# Patient Record
Sex: Female | Born: 1960 | Race: Black or African American | Hispanic: No | Marital: Single | Smoking: Never smoker
Health system: Southern US, Community
[De-identification: ages and names within clinical notes are randomized; demographics above are authoritative.]

## PROBLEM LIST (undated history)

## (undated) DIAGNOSIS — E669 Obesity, unspecified: Secondary | ICD-10-CM

## (undated) DIAGNOSIS — I10 Essential (primary) hypertension: Secondary | ICD-10-CM

---

## 2008-09-15 ENCOUNTER — Encounter: Admission: RE | Admit: 2008-09-15 | Discharge: 2008-12-14 | Payer: Self-pay | Admitting: Family Medicine

## 2009-01-01 ENCOUNTER — Encounter: Admission: RE | Admit: 2009-01-01 | Discharge: 2009-04-01 | Payer: Self-pay | Admitting: Family Medicine

## 2009-07-07 ENCOUNTER — Encounter: Admission: RE | Admit: 2009-07-07 | Discharge: 2009-07-07 | Payer: Self-pay | Admitting: Family Medicine

## 2009-07-25 ENCOUNTER — Emergency Department (HOSPITAL_BASED_OUTPATIENT_CLINIC_OR_DEPARTMENT_OTHER): Admission: EM | Admit: 2009-07-25 | Discharge: 2009-07-25 | Payer: Self-pay | Admitting: Emergency Medicine

## 2009-10-06 ENCOUNTER — Encounter: Admission: RE | Admit: 2009-10-06 | Discharge: 2009-10-06 | Payer: Self-pay | Admitting: Family Medicine

## 2010-03-01 ENCOUNTER — Encounter
Admission: RE | Admit: 2010-03-01 | Discharge: 2010-05-03 | Payer: Self-pay | Source: Home / Self Care | Attending: Family Medicine | Admitting: Family Medicine

## 2010-05-23 ENCOUNTER — Encounter: Payer: Self-pay | Admitting: Family Medicine

## 2010-06-13 ENCOUNTER — Encounter: Payer: Medicare Other | Attending: Family Medicine | Admitting: *Deleted

## 2010-06-13 ENCOUNTER — Encounter: Admit: 2010-06-13 | Payer: Self-pay | Admitting: Family Medicine

## 2010-06-13 DIAGNOSIS — Z713 Dietary counseling and surveillance: Secondary | ICD-10-CM | POA: Insufficient documentation

## 2010-07-25 LAB — COMPREHENSIVE METABOLIC PANEL
ALT: 11 U/L (ref 0–35)
AST: 22 U/L (ref 0–37)
Albumin: 4.1 g/dL (ref 3.5–5.2)
Alkaline Phosphatase: 122 U/L — ABNORMAL HIGH (ref 39–117)
Chloride: 102 mEq/L (ref 96–112)
GFR calc Af Amer: >60 mL/min (ref >60)
Potassium: 4.3 mEq/L (ref 3.5–5.1)
Sodium: 140 mEq/L (ref 135–145)
Total Bilirubin: 0.9 mg/dL (ref 0.3–1.2)

## 2010-07-25 LAB — URINALYSIS, ROUTINE W REFLEX MICROSCOPIC
Glucose, UA: NEGATIVE mg/dL
Protein, ur: NEGATIVE mg/dL
Urobilinogen, UA: 0.2 mg/dL (ref 0.0–1.0)

## 2010-07-25 LAB — CBC
HCT: 39.4 % (ref 36.0–46.0)
Hemoglobin: 12.9 g/dL (ref 12.0–15.0)
MCHC: 32.7 g/dL (ref 30.0–36.0)
MCV: 82.2 fL (ref 78.0–100.0)
Platelets: 230 K/uL (ref 150–400)
RBC: 4.79 MIL/uL (ref 3.87–5.11)
RDW: 14.1 % (ref 11.5–15.5)
WBC: 6.4 K/uL (ref 4.0–10.5)

## 2010-07-25 LAB — DIFFERENTIAL
Basophils Absolute: 0.1 K/uL (ref 0.0–0.1)
Basophils Relative: 2 % — ABNORMAL HIGH (ref 0–1)
Eosinophils Absolute: 0.1 K/uL (ref 0.0–0.7)
Eosinophils Relative: 1 % (ref 0–5)
Lymphocytes Relative: 16 % (ref 12–46)
Lymphs Abs: 1.1 K/uL (ref 0.7–4.0)
Monocytes Absolute: 0.3 K/uL (ref 0.1–1.0)
Monocytes Relative: 5 % (ref 3–12)
Neutro Abs: 4.8 K/uL (ref 1.7–7.7)
Neutrophils Relative %: 76 % (ref 43–77)

## 2010-08-11 ENCOUNTER — Ambulatory Visit: Payer: Self-pay | Admitting: *Deleted

## 2010-08-18 ENCOUNTER — Ambulatory Visit: Payer: Self-pay | Admitting: *Deleted

## 2011-02-07 ENCOUNTER — Ambulatory Visit: Payer: Medicare Other | Attending: Family Medicine | Admitting: Physical Therapy

## 2011-02-07 DIAGNOSIS — M6281 Muscle weakness (generalized): Secondary | ICD-10-CM | POA: Insufficient documentation

## 2011-02-07 DIAGNOSIS — M545 Low back pain, unspecified: Secondary | ICD-10-CM | POA: Insufficient documentation

## 2011-02-07 DIAGNOSIS — IMO0001 Reserved for inherently not codable concepts without codable children: Secondary | ICD-10-CM | POA: Insufficient documentation

## 2011-02-14 ENCOUNTER — Ambulatory Visit: Payer: Medicare Other | Admitting: Physical Therapy

## 2011-02-22 ENCOUNTER — Ambulatory Visit: Payer: Medicare Other | Admitting: Physical Therapy

## 2011-03-06 ENCOUNTER — Ambulatory Visit: Payer: Medicare Other | Attending: Family Medicine | Admitting: Physical Therapy

## 2011-03-06 DIAGNOSIS — M545 Low back pain, unspecified: Secondary | ICD-10-CM | POA: Insufficient documentation

## 2011-03-06 DIAGNOSIS — IMO0001 Reserved for inherently not codable concepts without codable children: Secondary | ICD-10-CM | POA: Insufficient documentation

## 2011-03-06 DIAGNOSIS — M6281 Muscle weakness (generalized): Secondary | ICD-10-CM | POA: Insufficient documentation

## 2011-03-09 ENCOUNTER — Ambulatory Visit: Payer: Medicare Other | Admitting: Physical Therapy

## 2011-03-13 ENCOUNTER — Ambulatory Visit: Payer: Medicare Other | Admitting: Physical Therapy

## 2011-03-16 ENCOUNTER — Ambulatory Visit: Payer: Medicare Other | Admitting: Physical Therapy

## 2011-03-20 ENCOUNTER — Ambulatory Visit: Payer: Medicare Other | Admitting: Physical Therapy

## 2011-03-22 ENCOUNTER — Ambulatory Visit: Payer: Medicare Other | Admitting: Physical Therapy

## 2011-03-27 ENCOUNTER — Encounter: Payer: Medicare Other | Admitting: Physical Therapy

## 2011-03-29 ENCOUNTER — Ambulatory Visit: Payer: Medicare Other | Admitting: Physical Therapy

## 2015-05-02 ENCOUNTER — Emergency Department (HOSPITAL_BASED_OUTPATIENT_CLINIC_OR_DEPARTMENT_OTHER): Payer: Medicare Other

## 2015-05-02 ENCOUNTER — Encounter (HOSPITAL_BASED_OUTPATIENT_CLINIC_OR_DEPARTMENT_OTHER): Payer: Self-pay | Admitting: Emergency Medicine

## 2015-05-02 ENCOUNTER — Emergency Department (HOSPITAL_BASED_OUTPATIENT_CLINIC_OR_DEPARTMENT_OTHER)
Admission: EM | Admit: 2015-05-02 | Discharge: 2015-05-02 | Disposition: A | Discharge status: Home / Self Care | Payer: Medicare Other | Attending: Emergency Medicine | Admitting: Emergency Medicine

## 2015-05-02 DIAGNOSIS — R51 Headache: Secondary | ICD-10-CM | POA: Diagnosis not present

## 2015-05-02 DIAGNOSIS — R5383 Other fatigue: Secondary | ICD-10-CM | POA: Insufficient documentation

## 2015-05-02 DIAGNOSIS — Z79899 Other long term (current) drug therapy: Secondary | ICD-10-CM | POA: Diagnosis not present

## 2015-05-02 DIAGNOSIS — E669 Obesity, unspecified: Secondary | ICD-10-CM | POA: Diagnosis not present

## 2015-05-02 DIAGNOSIS — J159 Unspecified bacterial pneumonia: Secondary | ICD-10-CM | POA: Diagnosis not present

## 2015-05-02 DIAGNOSIS — I10 Essential (primary) hypertension: Secondary | ICD-10-CM | POA: Diagnosis not present

## 2015-05-02 DIAGNOSIS — R52 Pain, unspecified: Secondary | ICD-10-CM | POA: Diagnosis present

## 2015-05-02 DIAGNOSIS — J189 Pneumonia, unspecified organism: Secondary | ICD-10-CM

## 2015-05-02 HISTORY — DX: Obesity, unspecified: E66.9

## 2015-05-02 HISTORY — DX: Essential (primary) hypertension: I10

## 2015-05-02 MED ORDER — ACETAMINOPHEN 325 MG PO TABS
650.0000 mg | ORAL_TABLET | Freq: Once | ORAL | Status: AC
  Administered 2015-05-02: 650 mg via ORAL
  Filled 2015-05-02: qty 2

## 2015-05-02 MED ORDER — AZITHROMYCIN 250 MG PO TABS: 250.0000 mg | ORAL_TABLET | Freq: Every day | ORAL | Status: AC

## 2015-05-02 NOTE — ED Notes (Signed)
Pt reports generalized body aches and headaches since yesterday, pt states that several family members are + for flu, cough and congestion

## 2015-05-02 NOTE — ED Provider Notes (Signed)
CSN: [id number]     Arrival date & time 05/02/15  1502 History   First MD Initiated Contact with Patient 05/02/15 1650     Chief Complaint  Patient presents with  . Generalized Body Aches   HPI  Sierra Kirby is a 55 year old female with PMHx of HTN and obesity presenting with myalgias and cough. She reports onset of symptoms yesterday. She states that she has felt fatigued with generalized myalgias. Her cough is productive of yellow sputum. Denies associated chest pain or SOB. She also complains of a generalized, throbbing headache. She has not taken any medications PTA for symptom control. She reports multiple family members in the household have similar symptoms. She did receive her flu vaccine this year. Denies fevers, chills, dizziness, syncope, vision changes, neck pain, neck stiffness, congestion, rhinorrhea, ear pain, eye pain, sore throat, abdominal pain, nausea, vomiting or back pain.   Past Medical History  Diagnosis Date  . Hypertension   . Obesity    History reviewed. No pertinent past surgical history. History reviewed. No pertinent family history. Social History  Substance Use Topics  . Smoking status: Never Smoker   . Smokeless tobacco: None  . Alcohol Use: No   OB History    No data available     Review of Systems  Constitutional: Positive for fatigue. Negative for fever and chills.  HENT: Negative for congestion, ear pain, rhinorrhea, sinus pressure and sore throat.   Eyes: Negative for visual disturbance.  Respiratory: Positive for cough. Negative for shortness of breath and wheezing.   Cardiovascular: Negative for chest pain.  Gastrointestinal: Negative for nausea, vomiting, abdominal pain and diarrhea.  Genitourinary: Negative.   Musculoskeletal: Positive for myalgias. Negative for back pain.  Skin: Negative for rash.  Neurological: Positive for headaches. Negative for dizziness, syncope and weakness.  All other systems reviewed and are  negative.     Allergies  Review of patient's allergies indicates no known allergies.  Home Medications   Prior to Admission medications   Medication Sig Start Date End Date Taking? Authorizing Provider  amLODipine (NORVASC) 2.5 MG tablet Take 2.5 mg by mouth daily.   Yes Historical Provider, MD  atorvastatin (LIPITOR) 20 MG tablet Take 20 mg by mouth daily.   Yes Historical Provider, MD  calcium carbonate (OSCAL) 1500 (600 Ca) MG TABS tablet Take by mouth 2 (two) times daily with a meal.   Yes Historical Provider, MD  cloNIDine (CATAPRES - DOSED IN MG/24 HR) 0.3 mg/24hr patch Place 0.3 mg onto the skin once a week.   Yes Historical Provider, MD  doxazosin (CARDURA) 1 MG tablet Take 1 mg by mouth daily.   Yes Historical Provider, MD  furosemide (LASIX) 40 MG tablet Take 40 mg by mouth.   Yes Historical Provider, MD  labetalol (NORMODYNE) 300 MG tablet Take 300 mg by mouth 2 (two) times daily.   Yes Historical Provider, MD  metolazone (ZAROXOLYN) 2.5 MG tablet Take 2.5 mg by mouth daily.   Yes Historical Provider, MD  omeprazole (PRILOSEC) 20 MG capsule Take 20 mg by mouth daily.   Yes Historical Provider, MD  potassium chloride SA (K-DUR,KLOR-CON) 20 MEQ tablet Take 20 mEq by mouth 2 (two) times daily.   Yes Historical Provider, MD  telmisartan (MICARDIS) 80 MG tablet Take 80 mg by mouth daily.   Yes Historical Provider, MD  traMADol (ULTRAM) 50 MG tablet Take by mouth every 6 (six) hours as needed.   Yes Historical Provider, MD  azithromycin (  ZITHROMAX) 250 MG tablet Take 1 tablet (250 mg total) by mouth daily. Take first 2 tablets together, then 1 every day until finished. 05/02/15   Yaritzi Craun, PA-C   BP 138/74 mmHg  Pulse 91  Temp(Src) 98.6 F (37 C) (Oral)  Resp 18  Ht 5\' 8"  (1.727 m)  Wt 204.119 kg  BMI 68.44 kg/m2  SpO2 100% Physical Exam  Constitutional: She is oriented to person, place, and time. She appears well-developed and well-nourished. No distress.  HENT:  Head:  Normocephalic and atraumatic.  Mouth/Throat: Oropharynx is clear and moist. No oropharyngeal exudate.  Eyes: Conjunctivae and EOM are normal. Pupils are equal, round, and reactive to light. Right eye exhibits no discharge. Left eye exhibits no discharge. No scleral icterus.  Neck: Normal range of motion. Neck supple.  Cardiovascular: Normal rate, regular rhythm and normal heart sounds.   Pulmonary/Chest: Effort normal and breath sounds normal. No respiratory distress. She has no wheezes. She has no rales.  Abdominal: Soft. There is no tenderness.  Musculoskeletal: Normal range of motion.  Pt moves all extremities spontaneously and walks with a steady gait  Lymphadenopathy:    She has no cervical adenopathy.  Neurological: She is alert and oriented to person, place, and time. No cranial nerve deficit. Coordination normal.  Cranial nerves 3-12 intact. Major muscle groups with 5/5 motor strength. Sensation to light touch intact. Coordinated finger to nose. Walks with a steady gait.   Skin: Skin is warm and dry. No rash noted.  Psychiatric: She has a normal mood and affect. Her behavior is normal.  Nursing note and vitals reviewed.   ED Course  Procedures (including critical care time) Labs Review Labs Reviewed - No data to display  Imaging Review Dg Chest 2 View  05/02/2015  CLINICAL DATA:  Cough and congestion for 1 day. EXAM: CHEST  2 VIEW COMPARISON:  None. FINDINGS: 2 cm focal opacity is identified in the right upper lung. No edema or pleural effusion. The cardiopericardial silhouette is within normal limits for size. The visualized bony structures of the thorax are intact. IMPRESSION: 2-3 cm focal density in the right upper lobe. Neoplasm could have this appearance. CT chest recommended to further evaluate. Electronically Signed   By: Kennith Center M.D.   On: 05/02/2015 16:57   Ct Chest Wo Contrast  05/02/2015  CLINICAL DATA:  Cough and congestion. Symptoms for 5 days. Shortness of breath  with exertion. Right upper lobe density on radiograph. EXAM: CT CHEST WITHOUT CONTRAST TECHNIQUE: Multidetector CT imaging of the chest was performed following the standard protocol without IV contrast. COMPARISON:  Radiographs earlier this day. FINDINGS: Mediastinum/Lymph Nodes: Heart normal in size. No pericardial effusion. No definite adenopathy allowing for lack contrast and soft tissue attenuation related to body habitus. Lungs/Pleura: Corresponding to abnormality on radiograph is an ill-defined 2.2 cm opacity with ill-defined lobular borders in the right upper lobe. This is primarily ground-glass opacity, however detailed characterization is partially limited due to the soft tissue attenuation related to body habitus. There is adjacent 5 mm nodular opacity in the periphery of the right upper lobe, image 17. A 5 mm nodular opacity in the anterior right upper lobe, image 27. The left lung is clear. No pleural effusion. Upper abdomen: Partially obscured, no definite acute abnormality. Musculoskeletal: Multilevel degenerative change throughout the spine. There are no acute or suspicious osseous abnormalities. IMPRESSION: Ill-defined opacity in the right upper lobe corresponding to abnormality on radiograph. This is primarily ground-glass opacity, with small adjacent  satellite nodular opacities. This may reflect infectious pneumonia/ pneumonitis. Pneumonia follow-up with radiograph in 3-4 weeks after course of treatment is recommended, however as this is better characterized on chest CT, ultimately follow-up CT in 3 months may be needed to confirm complete clearing and exclude underlying malignancy. Electronically Signed   By: Rubye Oaks M.D.   On: 05/02/2015 18:56   I have personally reviewed and evaluated these images and lab results as part of my medical decision-making.   EKG Interpretation None      MDM   Final diagnoses:  CAP (community acquired pneumonia)   Patient presenting with  productive cough, myalgias, fatigue and headache x 1 day. VSS. Lungs CTAB without wheeze or rale. No diminished lung sounds. No increased work of breathing. Oropharynx clear without erythema or exudate. Abdomen soft, non-tender. Patient has been diagnosed with CAP via chest CT. Pt is non-toxic appearing and without multiple comorbidities or immunocompromise therefore outpatient abx treatment is appropriate. Will discharge with azithromycin. Pt has been advised to return to the ED if symptoms worsen or do not improve. Discussed CT results with patient and that she will need follow up CXR in 3-4 weeks. Provided copy of CT results in pt's discharge paperwork. Pt verbalizes understanding and is agreeable with plan. Pt is stable for discharge.      Rolm Gala Yuvan Medinger, PA-C 05/02/15 2059  Nelva Nay, MD 05/03/15 [phone]

## 2015-05-02 NOTE — Discharge Instructions (Signed)
Schedule a follow up appointment with your PCP. The CT scan results are listed below with the radiologist recommendations. Show this to your PCP so he can schedule the tests for you  Ill-defined opacity in the right upper lobe corresponding to abnormality on radiograph. This is primarily ground-glass opacity, with small adjacent satellite nodular opacities. This may reflect infectious pneumonia/ pneumonitis. Pneumonia follow-up with radiograph in 3-4 weeks after course of treatment is recommended, however as this is better characterized on chest CT, ultimately follow-up CT in 3 months may be needed to confirm complete clearing   Community-Acquired Pneumonia, Adult Pneumonia is an infection of the lungs. There are different types of pneumonia. One type can develop while a person is in a hospital. A different type, called community-acquired pneumonia, develops in people who are not, or have not recently been, in the hospital or other health care facility.  CAUSES Pneumonia may be caused by bacteria, viruses, or funguses. Community-acquired pneumonia is often caused by Streptococcus pneumonia bacteria. These bacteria are often passed from one person to another by breathing in droplets from the cough or sneeze of an infected person. RISK FACTORS The condition is more likely to develop in:  People who havechronic diseases, such as chronic obstructive pulmonary disease (COPD), asthma, congestive heart failure, cystic fibrosis, diabetes, or kidney disease.  People who haveearly-stage or late-stage HIV.  People who havesickle cell disease.  People who havehad their spleen removed (splenectomy).  People who havepoor Administratordental hygiene.  People who havemedical conditions that increase the risk of breathing in (aspirating) secretions their own mouth and nose.   People who havea weakened immune system (immunocompromised).  People who smoke.  People whotravel to areas where  pneumonia-causing germs commonly exist.  People whoare around animal habitats or animals that have pneumonia-causing germs, including birds, bats, rabbits, cats, and farm animals. SYMPTOMS Symptoms of this condition include:  Adry cough.  A wet (productive) cough.  Fever.  Sweating.  Chest pain, especially when breathing deeply or coughing.  Rapid breathing or difficulty breathing.  Shortness of breath.  Shaking chills.  Fatigue.  Muscle aches. DIAGNOSIS Your health care provider will take a medical history and perform a physical exam. You may also have other tests, including:  Imaging studies of your chest, including X-rays.  Tests to check your blood oxygen level and other blood gases.  Other tests on blood, mucus (sputum), fluid around your lungs (pleural fluid), and urine. If your pneumonia is severe, other tests may be done to identify the specific cause of your illness. TREATMENT The type of treatment that you receive depends on many factors, such as the cause of your pneumonia, the medicines you take, and other medical conditions that you have. For most adults, treatment and recovery from pneumonia may occur at home. In some cases, treatment must happen in a hospital. Treatment may include:  Antibiotic medicines, if the pneumonia was caused by bacteria.  Antiviral medicines, if the pneumonia was caused by a virus.  Medicines that are given by mouth or through an IV tube.  Oxygen.  Respiratory therapy. Although rare, treating severe pneumonia may include:  Mechanical ventilation. This is done if you are not breathing well on your own and you cannot maintain a safe blood oxygen level.  Thoracentesis. This procedureremoves fluid around one lung or both lungs to help you breathe better. HOME CARE INSTRUCTIONS  Take over-the-counter and prescription medicines only as told by your health care provider.  Only takecough medicine if you are losing  sleep.  Understand that cough medicine can prevent your body's natural ability to remove mucus from your lungs.  If you were prescribed an antibiotic medicine, take it as told by your health care provider. Do not stop taking the antibiotic even if you start to feel better.  Sleep in a semi-upright position at night. Try sleeping in a reclining chair, or place a few pillows under your head.  Do not use tobacco products, including cigarettes, chewing tobacco, and e-cigarettes. If you need help quitting, ask your health care provider.  Drink enough water to keep your urine clear or pale yellow. This will help to thin out mucus secretions in your lungs. PREVENTION There are ways that you can decrease your risk of developing community-acquired pneumonia. Consider getting a pneumococcal vaccine if:  You are older than 53 years of age.  You are older than 53 years of age and are undergoing cancer treatment, have chronic lung disease, or have other medical conditions that affect your immune system. Ask your health care provider if this applies to you. There are different types and schedules of pneumococcal vaccines. Ask your health care provider which vaccination option is best for you. You may also prevent community-acquired pneumonia if you take these actions:  Get an influenza vaccine every year. Ask your health care provider which type of influenza vaccine is best for you.  Go to the dentist on a regular basis.  Wash your hands often. Use hand sanitizer if soap and water are not available. SEEK MEDICAL CARE IF:  You have a fever.  You are losing sleep because you cannot control your cough with cough medicine. SEEK IMMEDIATE MEDICAL CARE IF:  You have worsening shortness of breath.  You have increased chest pain.  Your sickness becomes worse, especially if you are an older adult or have a weakened immune system.  You cough up blood.   This information is not intended to replace advice given  to you by your health care provider. Make sure you discuss any questions you have with your health care provider.   Document Released: 04/17/2005 Document Revised: 01/06/2015 Document Reviewed: 08/12/2014 Elsevier Interactive Patient Education [employer]

## 2017-02-27 IMAGING — CT CT CHEST W/O CM
2 of 3 series · 14 of 36 positions shown, 17 images · non-contrast
Comparison: Radiographs earlier this day.

CLINICAL DATA: Cough and congestion. Symptoms for 5 days. Shortness
of breath with exertion. Right upper lobe density on radiograph.

EXAM:
CT CHEST WITHOUT CONTRAST
TECHNIQUE: Multidetector CT imaging of the chest was performed following the
standard protocol without IV contrast.

[Series 3: thorax · axial · 0.88mm/px · z∈[-354,-88]mm · 11 of 63 slices shown, 14 images]
[im 5/63  mediastinal]
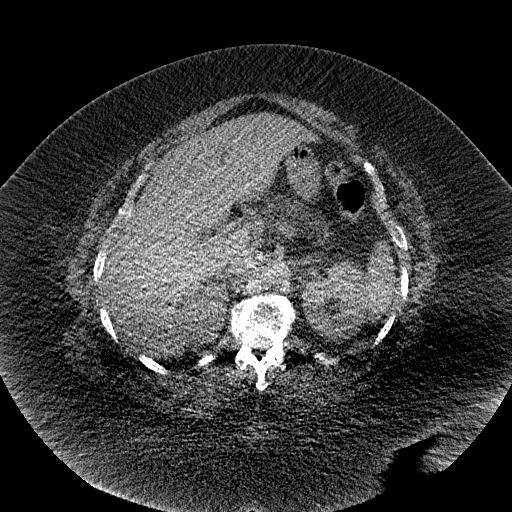
[im 5/63  lung]
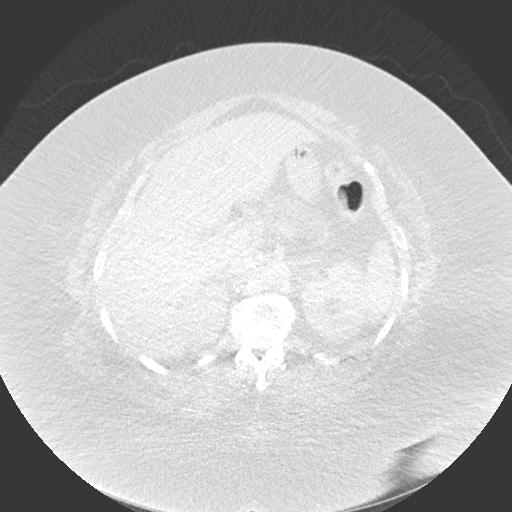
[im 10/63  lung]
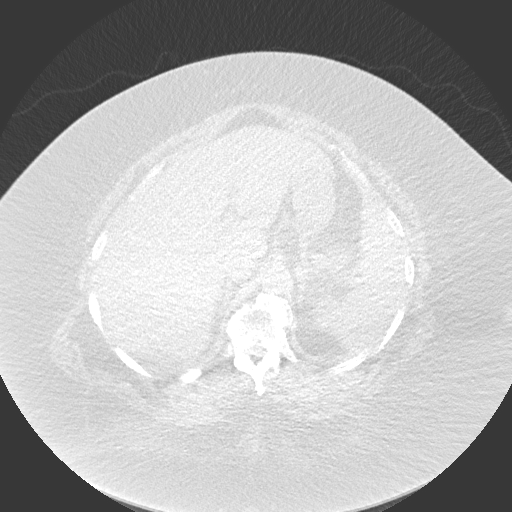
[im 14/63  lung]
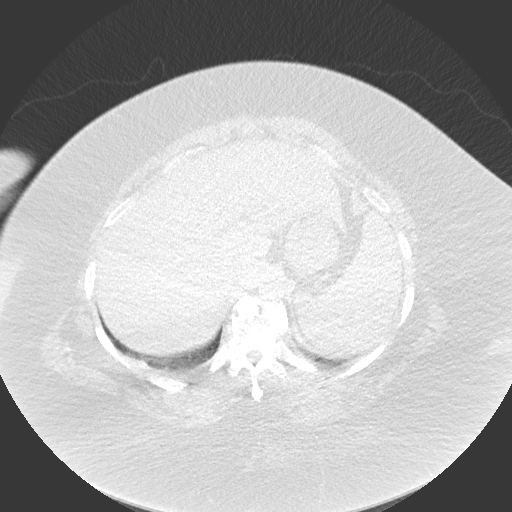
[im 21/63  lung]
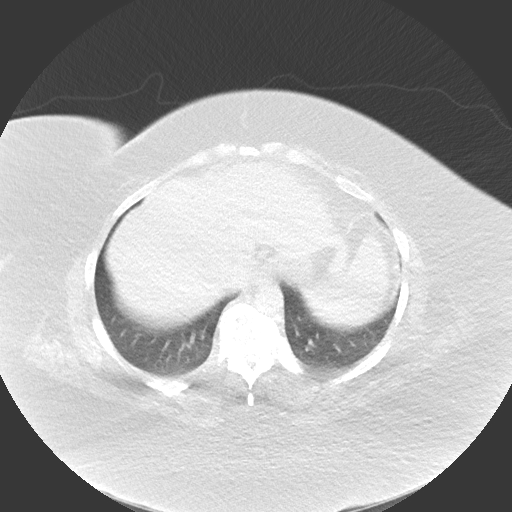
[im 26/63  mediastinal]
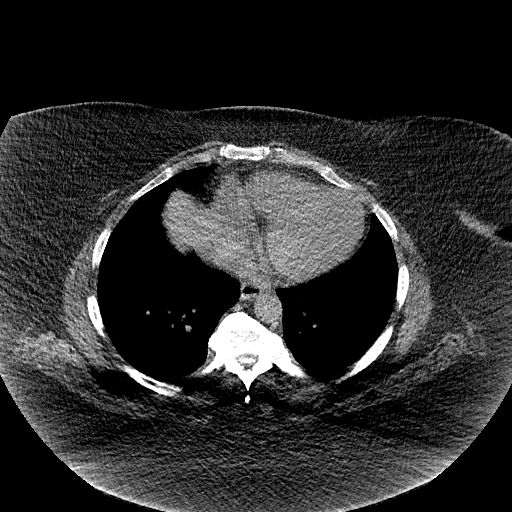
[im 26/63  lung]
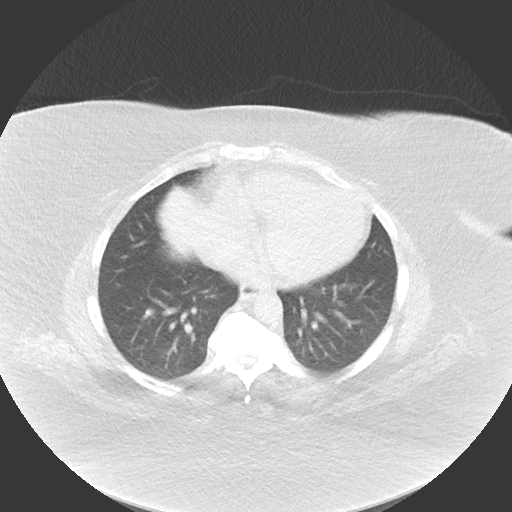
[im 33/63  lung]
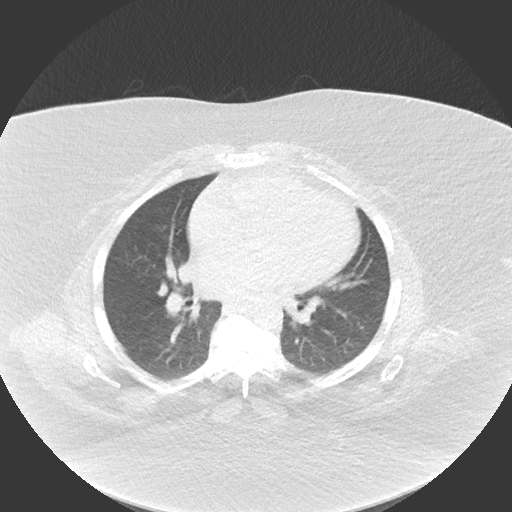
[im 37/63  lung]
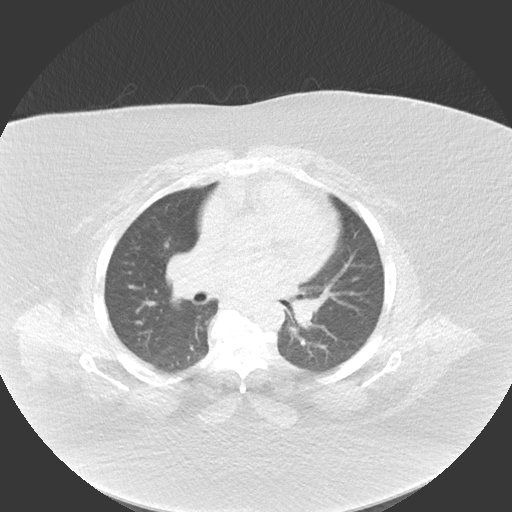
[im 42/63  lung]
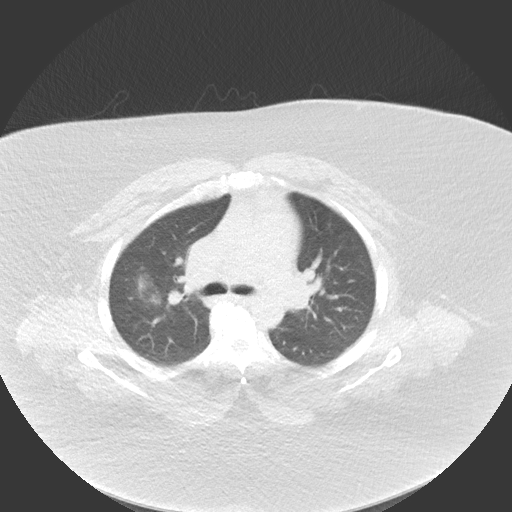
[im 49/63  mediastinal]
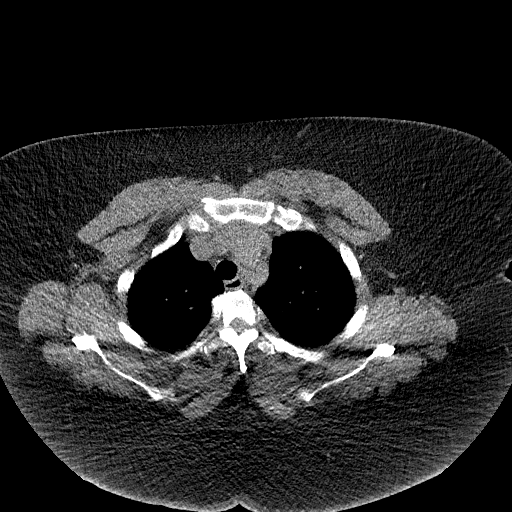
[im 49/63  lung]
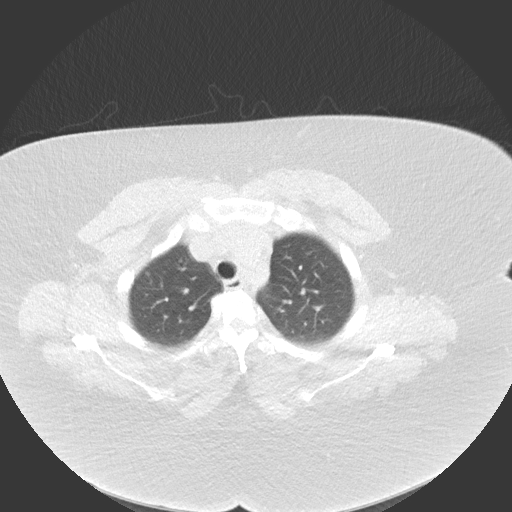
[im 53/63  lung]
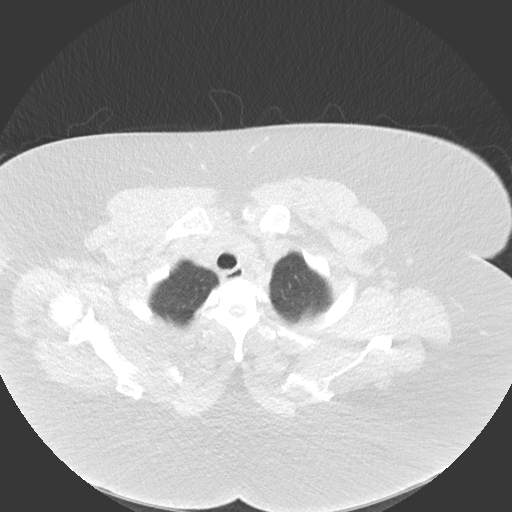
[im 58/63  lung]
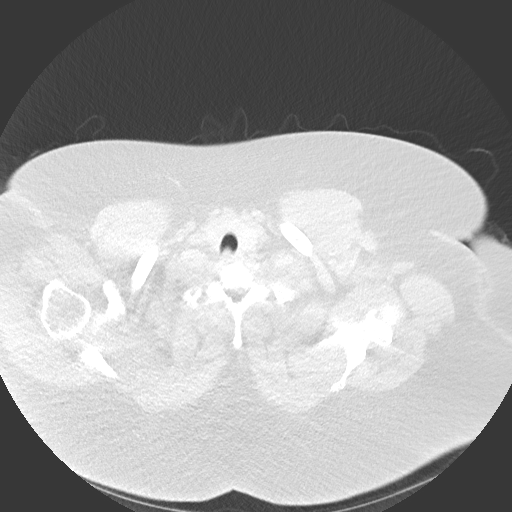

[Series 6: coronal · coronal · 0.61mm/px · 3 of 133 slices shown]
[im 27/133  lung]
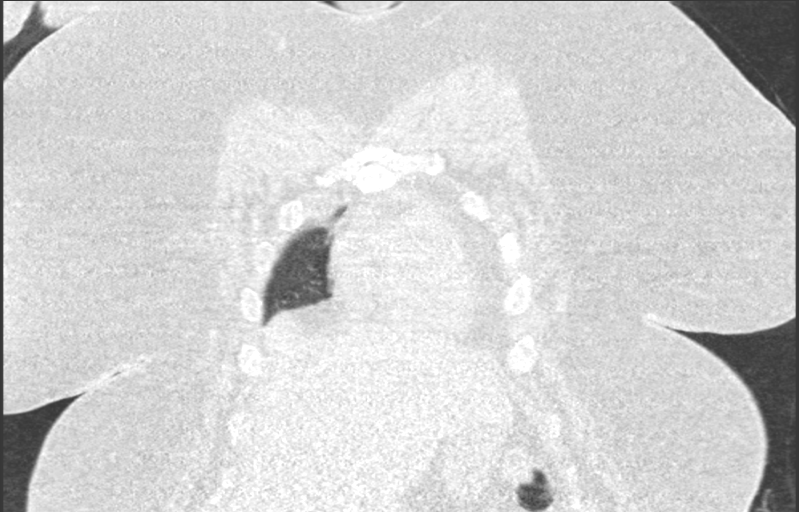
[im 53/133  lung]
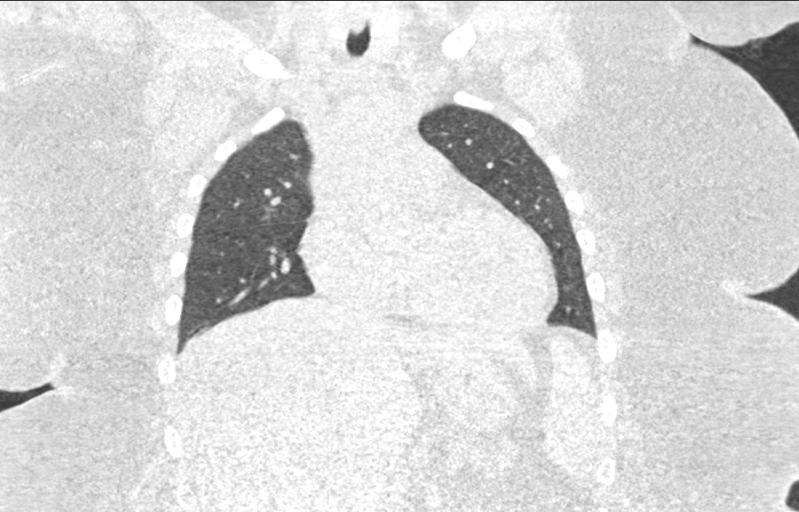
[im 80/133  lung]
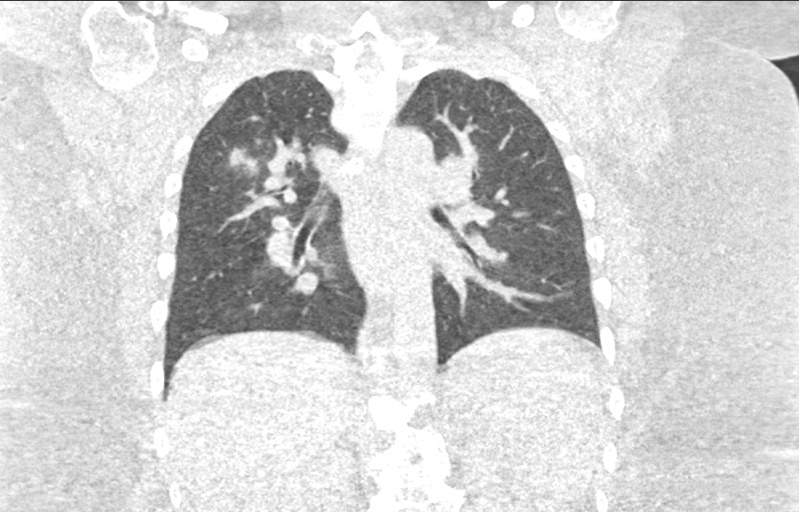

[14 of 36 positions shown; findings below may reference images not displayed]

FINDINGS: Mediastinum/Lymph Nodes: Heart normal in size. No pericardial
effusion. No definite adenopathy allowing for lack contrast and soft
tissue attenuation related to body habitus.

Lungs/Pleura: Corresponding to abnormality on radiograph is an
ill-defined 2.2 cm opacity with ill-defined lobular borders in the
right upper lobe. This is primarily ground-glass opacity, however
detailed characterization is partially limited due to the soft
tissue attenuation related to body habitus. There is adjacent 5 mm
nodular opacity in the periphery of the right upper lobe, image 17.
A 5 mm nodular opacity in the anterior right upper lobe, image 27.
The left lung is clear. No pleural effusion.

Upper abdomen: Partially obscured, no definite acute abnormality.

Musculoskeletal: Multilevel degenerative change throughout the
spine. There are no acute or suspicious osseous abnormalities.
IMPRESSION: Ill-defined opacity in the right upper lobe corresponding to
abnormality on radiograph. This is primarily ground-glass opacity,
with small adjacent satellite nodular opacities. This may reflect
infectious pneumonia/ pneumonitis. Pneumonia follow-up with
radiograph in 3-4 weeks after course of treatment is recommended,
however as this is better characterized on chest CT, ultimately
follow-up CT in 3 months may be needed to confirm complete clearing
and exclude underlying malignancy.
